# Patient Record
Sex: Male | Born: 1975 | Race: Black or African American | Hispanic: No | Marital: Single | Smoking: Current every day smoker
Health system: Southern US, Community
[De-identification: ages and names within clinical notes are randomized; demographics above are authoritative.]

---

## 2015-06-28 ENCOUNTER — Emergency Department (HOSPITAL_COMMUNITY)
Admission: EM | Admit: 2015-06-28 | Discharge: 2015-06-28 | Disposition: A | Discharge status: Home / Self Care | Payer: Self-pay | Attending: Emergency Medicine | Admitting: Emergency Medicine

## 2015-06-28 ENCOUNTER — Encounter (HOSPITAL_COMMUNITY): Payer: Self-pay | Admitting: Emergency Medicine

## 2015-06-28 DIAGNOSIS — F17200 Nicotine dependence, unspecified, uncomplicated: Secondary | ICD-10-CM | POA: Insufficient documentation

## 2015-06-28 DIAGNOSIS — Y9389 Activity, other specified: Secondary | ICD-10-CM | POA: Insufficient documentation

## 2015-06-28 DIAGNOSIS — Z880 Allergy status to penicillin: Secondary | ICD-10-CM | POA: Insufficient documentation

## 2015-06-28 DIAGNOSIS — Y92410 Unspecified street and highway as the place of occurrence of the external cause: Secondary | ICD-10-CM | POA: Insufficient documentation

## 2015-06-28 DIAGNOSIS — S4992XA Unspecified injury of left shoulder and upper arm, initial encounter: Secondary | ICD-10-CM | POA: Insufficient documentation

## 2015-06-28 DIAGNOSIS — S199XXA Unspecified injury of neck, initial encounter: Secondary | ICD-10-CM | POA: Insufficient documentation

## 2015-06-28 DIAGNOSIS — Y998 Other external cause status: Secondary | ICD-10-CM | POA: Insufficient documentation

## 2015-06-28 DIAGNOSIS — S4991XA Unspecified injury of right shoulder and upper arm, initial encounter: Secondary | ICD-10-CM | POA: Insufficient documentation

## 2015-06-28 MED ORDER — METHOCARBAMOL 500 MG PO TABS: 500.0000 mg | ORAL_TABLET | Freq: Two times a day (BID) | ORAL | Status: AC

## 2015-06-28 MED ORDER — NAPROXEN 500 MG PO TABS: 500.0000 mg | ORAL_TABLET | Freq: Two times a day (BID) | ORAL | Status: AC
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
## 2015-07-01 ENCOUNTER — Encounter (HOSPITAL_COMMUNITY): Payer: Self-pay | Admitting: Emergency Medicine

## 2015-07-01 ENCOUNTER — Emergency Department (HOSPITAL_COMMUNITY)
Admission: EM | Admit: 2015-07-01 | Discharge: 2015-07-01 | Disposition: A | Discharge status: Home / Self Care | Payer: No Typology Code available for payment source | Attending: Emergency Medicine | Admitting: Emergency Medicine

## 2015-07-01 DIAGNOSIS — Y999 Unspecified external cause status: Secondary | ICD-10-CM | POA: Insufficient documentation

## 2015-07-01 DIAGNOSIS — Y939 Activity, unspecified: Secondary | ICD-10-CM | POA: Diagnosis not present

## 2015-07-01 DIAGNOSIS — Z79899 Other long term (current) drug therapy: Secondary | ICD-10-CM | POA: Insufficient documentation

## 2015-07-01 DIAGNOSIS — Y92410 Unspecified street and highway as the place of occurrence of the external cause: Secondary | ICD-10-CM | POA: Diagnosis not present

## 2015-07-01 DIAGNOSIS — S161XXA Strain of muscle, fascia and tendon at neck level, initial encounter: Secondary | ICD-10-CM | POA: Diagnosis not present

## 2015-07-01 DIAGNOSIS — F17200 Nicotine dependence, unspecified, uncomplicated: Secondary | ICD-10-CM | POA: Insufficient documentation

## 2015-07-01 DIAGNOSIS — S199XXA Unspecified injury of neck, initial encounter: Secondary | ICD-10-CM | POA: Diagnosis present

## 2015-07-01 DIAGNOSIS — Z791 Long term (current) use of non-steroidal anti-inflammatories (NSAID): Secondary | ICD-10-CM | POA: Insufficient documentation

## 2015-07-01 DIAGNOSIS — M542 Cervicalgia: Secondary | ICD-10-CM

## 2015-07-01 MED ORDER — ACETAMINOPHEN 500 MG PO TABS: 500.0000 mg | ORAL_TABLET | Freq: Four times a day (QID) | ORAL | Status: AC | PRN

## 2015-07-01 MED ORDER — IBUPROFEN 800 MG PO TABS: 800.0000 mg | ORAL_TABLET | Freq: Three times a day (TID) | ORAL | Status: AC

## 2015-07-01 NOTE — Discharge Instructions (Signed)
Stop taking Naprosyn and start taking Ibuprofen and Tylenol.  Continue Robaxin as prescribed.  Cervical Sprain A cervical sprain is an injury in the neck in which the strong, fibrous tissues (ligaments) that connect your neck bones stretch or tear. Cervical sprains can range from mild to severe. Severe cervical sprains can cause the neck vertebrae to be unstable. This can lead to damage of the spinal cord and can result in serious nervous system problems. The amount of time it takes for a cervical sprain to get better depends on the cause and extent of the injury. Most cervical sprains heal in 1 to 3 weeks. CAUSES  Severe cervical sprains may be caused by:   Contact sport injuries (such as from football, rugby, wrestling, hockey, auto racing, gymnastics, diving, martial arts, or boxing).   Motor vehicle collisions.   Whiplash injuries. This is an injury from a sudden forward and backward whipping movement of the head and neck.  Falls.  Mild cervical sprains may be caused by:   Being in an awkward position, such as while cradling a telephone between your ear and shoulder.   Sitting in a chair that does not offer proper support.   Working at a poorly Marketing executive station.   Looking up or down for long periods of time.  SYMPTOMS   Pain, soreness, stiffness, or a burning sensation in the front, back, or sides of the neck. This discomfort may develop immediately after the injury or slowly, 24 hours or more after the injury.   Pain or tenderness directly in the middle of the back of the neck.   Shoulder or upper back pain.   Limited ability to move the neck.   Headache.   Dizziness.   Weakness, numbness, or tingling in the hands or arms.   Muscle spasms.   Difficulty swallowing or chewing.   Tenderness and swelling of the neck.  DIAGNOSIS  Most of the time your health care provider can diagnose a cervical sprain by taking your history and doing a physical  exam. Your health care provider will ask about previous neck injuries and any known neck problems, such as arthritis in the neck. X-rays may be taken to find out if there are any other problems, such as with the bones of the neck. Other tests, such as a CT scan or MRI, may also be needed.  TREATMENT  Treatment depends on the severity of the cervical sprain. Mild sprains can be treated with rest, keeping the neck in place (immobilization), and pain medicines. Severe cervical sprains are immediately immobilized. Further treatment is done to help with pain, muscle spasms, and other symptoms and may include:  Medicines, such as pain relievers, numbing medicines, or muscle relaxants.   Physical therapy. This may involve stretching exercises, strengthening exercises, and posture training. Exercises and improved posture can help stabilize the neck, strengthen muscles, and help stop symptoms from returning.  HOME CARE INSTRUCTIONS   Put ice on the injured area.   Put ice in a plastic bag.   Place a towel between your skin and the bag.   Leave the ice on for 15-20 minutes, 3-4 times a day.   If your injury was severe, you may have been given a cervical collar to wear. A cervical collar is a two-piece collar designed to keep your neck from moving while it heals.  Do not remove the collar unless instructed by your health care provider.  If you have long hair, keep it outside of the  collar.  Ask your health care provider before making any adjustments to your collar. Minor adjustments may be required over time to improve comfort and reduce pressure on your chin or on the back of your head.  Ifyou are allowed to remove the collar for cleaning or bathing, follow your health care provider's instructions on how to do so safely.  Keep your collar clean by wiping it with mild soap and water and drying it completely. If the collar you have been given includes removable pads, remove them every 1-2 days  and hand wash them with soap and water. Allow them to air dry. They should be completely dry before you wear them in the collar.  If you are allowed to remove the collar for cleaning and bathing, wash and dry the skin of your neck. Check your skin for irritation or sores. If you see any, tell your health care provider.  Do not drive while wearing the collar.   Only take over-the-counter or prescription medicines for pain, discomfort, or fever as directed by your health care provider.   Keep all follow-up appointments as directed by your health care provider.   Keep all physical therapy appointments as directed by your health care provider.   Make any needed adjustments to your workstation to promote good posture.   Avoid positions and activities that make your symptoms worse.   Warm up and stretch before being active to help prevent problems.  SEEK MEDICAL CARE IF:   Your pain is not controlled with medicine.   You are unable to decrease your pain medicine over time as planned.   Your activity level is not improving as expected.  SEEK IMMEDIATE MEDICAL CARE IF:   You develop any bleeding.  You develop stomach upset.  You have signs of an allergic reaction to your medicine.   Your symptoms get worse.   You develop new, unexplained symptoms.   You have numbness, tingling, weakness, or paralysis in any part of your body.  MAKE SURE YOU:   Understand these instructions.  Will watch your condition.  Will get help right away if you are not doing well or get worse.   This information is not intended to replace advice given to you by your health care provider. Make sure you discuss any questions you have with your health care provider.   Document Released: 11/30/2006 Document Revised: 02/07/2013 Document Reviewed: 08/10/2012 Elsevier Interactive Patient Education [employer]2016 Elsevier Inc.
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
## 2015-07-06 ENCOUNTER — Emergency Department (HOSPITAL_COMMUNITY)
Admission: EM | Admit: 2015-07-06 | Discharge: 2015-07-07 | Disposition: A | Discharge status: Home / Self Care | Payer: No Typology Code available for payment source | Source: Home / Self Care

## 2015-07-06 ENCOUNTER — Encounter (HOSPITAL_COMMUNITY): Payer: Self-pay | Admitting: Nurse Practitioner

## 2015-07-06 DIAGNOSIS — Z5321 Procedure and treatment not carried out due to patient leaving prior to being seen by health care provider: Secondary | ICD-10-CM | POA: Insufficient documentation

## 2015-07-06 DIAGNOSIS — Z79899 Other long term (current) drug therapy: Secondary | ICD-10-CM | POA: Diagnosis not present

## 2015-07-06 DIAGNOSIS — Y92410 Unspecified street and highway as the place of occurrence of the external cause: Secondary | ICD-10-CM | POA: Diagnosis not present

## 2015-07-06 DIAGNOSIS — Y999 Unspecified external cause status: Secondary | ICD-10-CM | POA: Diagnosis not present

## 2015-07-06 DIAGNOSIS — M25512 Pain in left shoulder: Secondary | ICD-10-CM | POA: Insufficient documentation

## 2015-07-06 DIAGNOSIS — F17200 Nicotine dependence, unspecified, uncomplicated: Secondary | ICD-10-CM | POA: Insufficient documentation

## 2015-07-06 DIAGNOSIS — Y939 Activity, unspecified: Secondary | ICD-10-CM | POA: Diagnosis not present

## 2015-07-06 DIAGNOSIS — M542 Cervicalgia: Secondary | ICD-10-CM | POA: Insufficient documentation
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
## 2015-07-07 ENCOUNTER — Encounter (HOSPITAL_COMMUNITY): Payer: Self-pay

## 2015-07-07 ENCOUNTER — Emergency Department (HOSPITAL_COMMUNITY)
Admission: EM | Admit: 2015-07-07 | Discharge: 2015-07-07 | Disposition: A | Discharge status: Home / Self Care | Payer: No Typology Code available for payment source | Attending: Emergency Medicine | Admitting: Emergency Medicine

## 2015-07-07 DIAGNOSIS — M25512 Pain in left shoulder: Secondary | ICD-10-CM

## 2015-07-07 DIAGNOSIS — M542 Cervicalgia: Secondary | ICD-10-CM

## 2015-07-07 MED ORDER — KETOROLAC TROMETHAMINE 60 MG/2ML IM SOLN
60.0000 mg | Freq: Once | INTRAMUSCULAR | Status: AC
  Administered 2015-07-07: 60 mg via INTRAMUSCULAR
  Filled 2015-07-07: qty 2
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
## 2015-09-15 ENCOUNTER — Emergency Department (HOSPITAL_COMMUNITY)
Admission: EM | Admit: 2015-09-15 | Discharge: 2015-09-16 | Disposition: A | Discharge status: Home / Self Care | Payer: Self-pay | Attending: Emergency Medicine | Admitting: Emergency Medicine

## 2015-09-15 ENCOUNTER — Encounter (HOSPITAL_COMMUNITY): Payer: Self-pay | Admitting: Nurse Practitioner

## 2015-09-15 DIAGNOSIS — F17200 Nicotine dependence, unspecified, uncomplicated: Secondary | ICD-10-CM | POA: Insufficient documentation

## 2015-09-15 DIAGNOSIS — L723 Sebaceous cyst: Secondary | ICD-10-CM | POA: Insufficient documentation

## 2015-09-15 DIAGNOSIS — Z79899 Other long term (current) drug therapy: Secondary | ICD-10-CM | POA: Insufficient documentation

## 2015-09-15 NOTE — ED Triage Notes (Signed)
Pt has a cyst like swelling on the left mastoid process that he notes has been there for a couple of weeks but started hurting today. Denies any symptoms including headaches.

## 2015-09-16 MED ORDER — LIDOCAINE HCL (PF) 1 % IJ SOLN
2.0000 mL | Freq: Once | INTRAMUSCULAR | Status: AC
  Administered 2015-09-16: 30 mL
  Filled 2015-09-16: qty 30

## 2015-09-16 NOTE — ED Provider Notes (Signed)
[facility]-EMERGENCY DEPT Provider Note   CSN: [id number] Arrival date & time: 09/15/15  2320  First Provider Contact:  None    By signing my name below, I, Paul Everett, attest that this documentation has been prepared under the direction and in the presence of Earley Favor, NP.  Electronically Signed: Octavia Heir, ED Scribe. 09/16/15. 12:25 AM.    History   Chief Complaint Chief Complaint  Patient presents with  . Mastoid Process Swelling    The history is provided by the patient. No language interpreter was used.   HPI Comments: Paul Everett is a 40 y.o. male who presents to the Emergency Department complaining of a constant, gradual worsening, moderate, "knot" to the bottom of his left ear lobe onset a few weeks ago. He notes associated headache. Pt says he noticed it a few weeks ago but just recently started having pain in the area. There has been no drainage noted. He has not taken any medication for his pain. Pt has not has had no prior hx of cysts in the past. He denies any other symptoms.   History reviewed. No pertinent past medical history.  There are no active problems to display for this patient.   History reviewed. No pertinent surgical history.     Home Medications    Prior to Admission medications   Medication Sig Start Date End Date Taking? Authorizing Provider  acetaminophen (TYLENOL) 500 MG tablet Take 1 tablet (500 mg total) by mouth every 6 (six) hours as needed. 07/01/15   Cheri Fowler, PA-C  ibuprofen (ADVIL,MOTRIN) 800 MG tablet Take 1 tablet (800 mg total) by mouth 3 (three) times daily. 07/01/15   Cheri Fowler, PA-C  methocarbamol (ROBAXIN) 500 MG tablet Take 1 tablet (500 mg total) by mouth 2 (two) times daily. 06/28/15   Heather Laisure, PA-C  naproxen (NAPROSYN) 500 MG tablet Take 1 tablet (500 mg total) by mouth 2 (two) times daily. 06/28/15   Santiago Glad, PA-C    Family History History reviewed. No pertinent family history.  Social  History Social History  Substance Use Topics  . Smoking status: Current Every Day Smoker  . Smokeless tobacco: Never Used  . Alcohol use No     Allergies   Penicillins   Review of Systems Review of Systems  Skin: Positive for wound (cyst).  Neurological: Positive for headaches.  All other systems reviewed and are negative.    Physical Exam Updated Vital Signs BP 141/93 (BP Location: Right Arm)   Pulse 92   Temp 98.3 F (36.8 C) (Oral)   Resp 20   Ht  (1.753 m)   Wt 72.6 kg   SpO2 97%   BMI 23.63 kg/m   Physical Exam  Constitutional: He is oriented to person, place, and time. He appears well-developed and well-nourished.  HENT:  Head: Normocephalic.  Eyes: EOM are normal.  Neck: Normal range of motion.  Pulmonary/Chest: Effort normal.  Abdominal: He exhibits no distension.  Musculoskeletal: Normal range of motion.  Neurological: He is alert and oriented to person, place, and time.  Skin:  Firm, mobile, pea sized cysts at the base of the ear lobe  Psychiatric: He has a normal mood and affect.  Nursing note and vitals reviewed.    ED Treatments / Results  DIAGNOSTIC STUDIES: Oxygen Saturation is 97% on RA, normal by my interpretation.  COORDINATION OF CARE:  12:23 AM Discussed treatment plan with pt at bedside and pt agreed to plan.  Labs (all labs ordered  are listed, but only abnormal results are displayed) Labs Reviewed - No data to display  EKG  EKG Interpretation None       Radiology No results found.  Procedures .Marland KitchenIncision and Drainage Date/Time: 09/16/2015 12:59 AM Performed by: Earley Favor Authorized by: Earley Favor   Consent:    Consent obtained:  Verbal   Consent given by:  Patient   Risks discussed:  Pain and bleeding   Alternatives discussed:  No treatment Location:    Type:  Seroma   Size:  .5 cm   Location:  Head   Head location:  Face Pre-procedure details:    Skin preparation:  Antiseptic wash and  Betadine Anesthesia (see MAR for exact dosages):    Anesthesia method:  Local infiltration   Local anesthetic:  Lidocaine 2% w/o epi Procedure type:    Complexity:  Simple Procedure details:    Needle aspiration: no     Incision types:  Stab incision   Incision depth:  Dermal   Scalpel blade:  11   Wound management:  Probed and deloculated   Drainage characteristics: serumen.   Drainage amount:  Copious   Wound treatment:  Wound left open   Packing materials:  None Post-procedure details:    Patient tolerance of procedure:  Tolerated well, no immediate complications   (including critical care time)  Medications Ordered in ED Medications  lidocaine (PF) (XYLOCAINE) 1 % injection 2 mL (30 mLs Infiltration Given 09/16/15 0059)     Initial Impression / Assessment and Plan / ED Course  I have reviewed the triage vital signs and the nursing notes.  Pertinent labs & imaging results that were available during my care of the patient were reviewed by me and considered in my medical decision making (see chart for details).  Clinical Course     I&D preformed with cyst removed   Final Clinical Impressions(s) / ED Diagnoses   Final diagnoses:  Sebaceous cyst   I personally performed the services described in this documentation, which was scribed in my presence. The recorded information has been reviewed and is accurate. New Prescriptions New Prescriptions   No medications on file     Earley Favor, NP 09/16/15 0101    Earley Favor, NP 09/16/15 8101    Paula Libra, MD 09/16/15 0730

## 2015-09-16 NOTE — Discharge Instructions (Signed)
The cyst has been removed

## 2016-08-29 ENCOUNTER — Emergency Department (HOSPITAL_COMMUNITY): Payer: No Typology Code available for payment source

## 2016-08-29 ENCOUNTER — Emergency Department (HOSPITAL_COMMUNITY)
Admission: EM | Admit: 2016-08-29 | Discharge: 2016-08-29 | Payer: No Typology Code available for payment source | Attending: Emergency Medicine | Admitting: Emergency Medicine

## 2016-08-29 ENCOUNTER — Encounter (HOSPITAL_COMMUNITY): Payer: Self-pay

## 2016-08-29 DIAGNOSIS — M79672 Pain in left foot: Secondary | ICD-10-CM | POA: Diagnosis not present

## 2016-08-29 DIAGNOSIS — R202 Paresthesia of skin: Secondary | ICD-10-CM | POA: Diagnosis not present

## 2016-08-29 DIAGNOSIS — Z79899 Other long term (current) drug therapy: Secondary | ICD-10-CM | POA: Insufficient documentation

## 2016-08-29 DIAGNOSIS — M542 Cervicalgia: Secondary | ICD-10-CM | POA: Diagnosis not present

## 2016-08-29 DIAGNOSIS — F17210 Nicotine dependence, cigarettes, uncomplicated: Secondary | ICD-10-CM | POA: Diagnosis not present

## 2016-08-29 MED ORDER — SODIUM CHLORIDE 0.9 % IV BOLUS (SEPSIS): 1000.0000 mL | Freq: Once | INTRAVENOUS | Status: DC

## 2016-08-29 MED ORDER — ACETAMINOPHEN 500 MG PO TABS
1000.0000 mg | ORAL_TABLET | Freq: Once | ORAL | Status: AC
  Administered 2016-08-29: 1000 mg via ORAL
  Filled 2016-08-29: qty 2
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
## 2016-08-29 NOTE — ED Notes (Signed)
PT REFUSED ALL BLOOD DRAW, IV PLACEMENT, AND MEDICINES. PT STS HE IS JUST TRYING TO MAKE HIS MOTHER'S FUNERAL TODAY. ERA MADE AWARE.
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
## 2016-08-29 NOTE — ED Provider Notes (Signed)
I was not informed about anything about this patient  by nursing staff.   Devoria AlbeKnapp, Xolani Degracia, MD 08/29/16 [phone]

## 2016-08-29 NOTE — ED Notes (Signed)
He declines IVF or any blood testing and is d/c'd. With police at this time. He ambulates capably.
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
## 2016-10-13 ENCOUNTER — Emergency Department (HOSPITAL_COMMUNITY)
Admission: EM | Admit: 2016-10-13 | Discharge: 2016-10-13 | Disposition: A | Discharge status: Home / Self Care | Payer: Self-pay | Attending: Emergency Medicine | Admitting: Emergency Medicine

## 2016-10-13 ENCOUNTER — Encounter (HOSPITAL_COMMUNITY): Payer: Self-pay | Admitting: Emergency Medicine

## 2016-10-13 DIAGNOSIS — Z880 Allergy status to penicillin: Secondary | ICD-10-CM | POA: Insufficient documentation

## 2016-10-13 DIAGNOSIS — F17210 Nicotine dependence, cigarettes, uncomplicated: Secondary | ICD-10-CM | POA: Insufficient documentation

## 2016-10-13 DIAGNOSIS — K047 Periapical abscess without sinus: Secondary | ICD-10-CM | POA: Insufficient documentation

## 2016-10-13 MED ORDER — OXYCODONE-ACETAMINOPHEN 5-325 MG PO TABS: 1.0000 | ORAL_TABLET | ORAL | 0 refills | Status: AC | PRN

## 2016-10-13 MED ORDER — CEPHALEXIN 500 MG PO CAPS: 500.0000 mg | ORAL_CAPSULE | Freq: Four times a day (QID) | ORAL | 0 refills | Status: AC

## 2016-10-13 MED ORDER — METRONIDAZOLE 500 MG PO TABS: 500.0000 mg | ORAL_TABLET | Freq: Two times a day (BID) | ORAL | 0 refills | Status: AC

## 2016-10-13 MED ORDER — OXYCODONE-ACETAMINOPHEN 5-325 MG PO TABS
1.0000 | ORAL_TABLET | Freq: Once | ORAL | Status: AC
Start: 2016-10-13 — End: 2016-10-13
  Administered 2016-10-13: 1 via ORAL
  Filled 2016-10-13: qty 1

## 2016-10-13 NOTE — ED Provider Notes (Signed)
[facility]-EMERGENCY DEPT Provider Note   CSN: [id number] Arrival date & time: 10/13/16  1258     History   Chief Complaint Chief Complaint  Patient presents with  . Dental Pain    HPI Paul Everett is a 41 y.o. male.  HPI   41 year old male presents today with complaints of dental infection.  Patient reports that 3 days ago he started developing swelling and pain to the right upper dentition.  Patient notes he had an area that felt like an abscess that ruptured with discharge.  He notes since then some of the swelling of his face has improved but he still having pain and swelling.  Patient denies any fevers, spreading of infection, neck pain, stiffness, trismus, any other significant complaints.  Patient notes medications at home including Tylenol are not improving his symptoms.  Patient reports that he does not have a dentist.    History reviewed. No pertinent past medical history.  There are no active problems to display for this patient.   History reviewed. No pertinent surgical history.     Home Medications    Prior to Admission medications   Medication Sig Start Date End Date Taking? Authorizing Provider  acetaminophen (TYLENOL) 500 MG tablet Take 1 tablet (500 mg total) by mouth every 6 (six) hours as needed. 07/01/15   Cheri Fowler, PA-C  cephALEXin (KEFLEX) 500 MG capsule Take 1 capsule (500 mg total) by mouth 4 (four) times daily. 10/13/16   Milady Fleener, Tinnie Gens, PA-C  ibuprofen (ADVIL,MOTRIN) 800 MG tablet Take 1 tablet (800 mg total) by mouth 3 (three) times daily. 07/01/15   Cheri Fowler, PA-C  methocarbamol (ROBAXIN) 500 MG tablet Take 1 tablet (500 mg total) by mouth 2 (two) times daily. 06/28/15   Santiago Glad, PA-C  metroNIDAZOLE (FLAGYL) 500 MG tablet Take 1 tablet (500 mg total) by mouth 2 (two) times daily. 10/13/16   Shanah Guimaraes, Tinnie Gens, PA-C  naproxen (NAPROSYN) 500 MG tablet Take 1 tablet (500 mg total) by mouth 2 (two) times daily. 06/28/15   Santiago Glad, PA-C    oxyCODONE-acetaminophen (PERCOCET/ROXICET) 5-325 MG tablet Take 1 tablet by mouth every 4 (four) hours as needed for severe pain. 10/13/16   Eyvonne Mechanic, PA-C    Family History History reviewed. No pertinent family history.  Social History Social History  Substance Use Topics  . Smoking status: Current Every Day Smoker    Packs/day: 0.50    Types: Cigarettes  . Smokeless tobacco: Never Used  . Alcohol use Yes     Allergies   Penicillins   Review of Systems Review of Systems  All other systems reviewed and are negative.    Physical Exam Updated Vital Signs BP (!) 149/92 (BP Location: Left Arm)   Pulse 100   Temp 98.5 F (36.9 C) (Oral)   Resp 16   SpO2 98%   Physical Exam  Constitutional: He is oriented to person, place, and time. He appears well-developed and well-nourished.  HENT:  Head: Normocephalic and atraumatic.  Swelling along the upper right maxillary gumline, no fluctuance.  Her mouth is soft, jaw full active range of motion.  Unilateral swelling of the face to the right upper maxillary region, no overlying lying cellulitis.  Neck supple full active range of motion nontender  Eyes: Pupils are equal, round, and reactive to light. Conjunctivae are normal. Right eye exhibits no discharge. Left eye exhibits no discharge. No scleral icterus.  Neck: Normal range of motion. No JVD present. No tracheal deviation present.  Pulmonary/Chest:  Effort normal. No stridor.  Neurological: He is alert and oriented to person, place, and time. Coordination normal.  Psychiatric: He has a normal mood and affect. His behavior is normal. Judgment and thought content normal.  Nursing note and vitals reviewed.    ED Treatments / Results  Labs (all labs ordered are listed, but only abnormal results are displayed) Labs Reviewed - No data to display  EKG  EKG Interpretation None       Radiology No results found.  Procedures Procedures (including critical care  time)  Medications Ordered in ED Medications  oxyCODONE-acetaminophen (PERCOCET/ROXICET) 5-325 MG per tablet 1 tablet (1 tablet Oral Given 10/13/16 1632)     Initial Impression / Assessment and Plan / ED Course  I have reviewed the triage vital signs and the nursing notes.  Pertinent labs & imaging results that were available during my care of the patient were reviewed by me and considered in my medical decision making (see chart for details).    40 year old male presents today with dental infection.  No drainable abscess, no signs of deep space infection.  Well-appearing in no acute distress.  He will be given pain medication antibiotics and follow-up resources.  Patient is allergic to penicillins, and cannot afford clindamycin.  I discussed with pharmacy who recommended dual therapy including metronidazole and Keflex.  Patient will be placed on his medications, given strict return precautions, he verbalized understanding and agreement to today's plan had no further questions or concerns the time discharge.  Final Clinical Impressions(s) / ED Diagnoses   Final diagnoses:  Dental infection    New Prescriptions Discharge Medication List as of 10/13/2016  4:23 PM    START taking these medications   Details  oxyCODONE-acetaminophen (PERCOCET/ROXICET) 5-325 MG tablet Take 1 tablet by mouth every 4 (four) hours as needed for severe pain., Starting Tue 10/13/2016, Print         Tammey Deeg, Tinnie Gens, PA-C 10/13/16 2111    Mancel Bale, MD 10/14/16 [phone]

## 2016-10-13 NOTE — ED Triage Notes (Signed)
Pt states he woke up yesterday and had swelling to right cheek from a bad tooth. Pt states he is in a lot of pain. Has been sleeping on a heating pack

## 2016-10-13 NOTE — Discharge Instructions (Signed)
Please read attached information. If you experience any new or worsening signs or symptoms please return to the emergency room for evaluation. Please follow-up with your primary care provider or specialist as discussed. Please use medication prescribed only as directed and discontinue taking if you have any concerning signs or symptoms.   °

## 2016-10-13 NOTE — ED Notes (Signed)
See EDP secondary assessment.  
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
